# Patient Record
Sex: Female | Born: 1970 | Hispanic: Yes | Marital: Single | Smoking: Never smoker
Health system: Southern US, Community
[De-identification: ages and names within clinical notes are randomized; demographics above are authoritative.]

## PROBLEM LIST (undated history)

## (undated) DIAGNOSIS — I10 Essential (primary) hypertension: Secondary | ICD-10-CM

## (undated) DIAGNOSIS — K754 Autoimmune hepatitis: Secondary | ICD-10-CM

## (undated) HISTORY — DX: Essential (primary) hypertension: I10

## (undated) HISTORY — PX: TUBAL LIGATION: SHX77

## (undated) HISTORY — DX: Autoimmune hepatitis: K75.4

---

## 2004-08-29 ENCOUNTER — Ambulatory Visit: Payer: Self-pay

## 2008-05-11 ENCOUNTER — Ambulatory Visit: Payer: Self-pay

## 2008-05-26 ENCOUNTER — Ambulatory Visit: Payer: Self-pay

## 2008-11-24 ENCOUNTER — Ambulatory Visit: Payer: Self-pay

## 2010-07-17 ENCOUNTER — Ambulatory Visit: Payer: Self-pay

## 2012-12-30 DIAGNOSIS — R87613 High grade squamous intraepithelial lesion on cytologic smear of cervix (HGSIL): Secondary | ICD-10-CM | POA: Insufficient documentation

## 2012-12-30 DIAGNOSIS — R87810 Cervical high risk human papillomavirus (HPV) DNA test positive: Secondary | ICD-10-CM | POA: Insufficient documentation

## 2013-11-14 ENCOUNTER — Emergency Department: Payer: Self-pay | Admitting: Emergency Medicine

## 2013-11-24 ENCOUNTER — Ambulatory Visit: Payer: Self-pay

## 2014-01-05 ENCOUNTER — Ambulatory Visit: Payer: Self-pay

## 2014-01-11 ENCOUNTER — Ambulatory Visit: Payer: Self-pay

## 2014-01-20 ENCOUNTER — Encounter: Payer: Self-pay | Admitting: *Deleted

## 2014-01-31 ENCOUNTER — Encounter: Payer: Self-pay | Admitting: General Surgery

## 2014-01-31 ENCOUNTER — Ambulatory Visit (INDEPENDENT_AMBULATORY_CARE_PROVIDER_SITE_OTHER): Payer: PRIVATE HEALTH INSURANCE | Admitting: General Surgery

## 2014-01-31 VITALS — BP 160/121 | HR 66 | Resp 16 | Ht 62.0 in | Wt 160.0 lb

## 2014-01-31 DIAGNOSIS — N6002 Solitary cyst of left breast: Secondary | ICD-10-CM

## 2014-01-31 DIAGNOSIS — N6001 Solitary cyst of right breast: Secondary | ICD-10-CM

## 2014-01-31 NOTE — Patient Instructions (Addendum)
Return in 3 months for follow up bilateral breast exam and ultrasound.
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
## 2014-03-01 ENCOUNTER — Encounter: Payer: Self-pay | Admitting: General Surgery

## 2014-05-25 ENCOUNTER — Ambulatory Visit: Payer: PRIVATE HEALTH INSURANCE

## 2014-05-25 ENCOUNTER — Encounter: Payer: Self-pay | Admitting: General Surgery

## 2014-05-25 ENCOUNTER — Ambulatory Visit (INDEPENDENT_AMBULATORY_CARE_PROVIDER_SITE_OTHER): Payer: PRIVATE HEALTH INSURANCE | Admitting: General Surgery

## 2014-05-25 VITALS — BP 158/96 | HR 80 | Resp 14 | Ht 62.0 in | Wt 164.0 lb

## 2014-05-25 DIAGNOSIS — N6002 Solitary cyst of left breast: Secondary | ICD-10-CM

## 2014-05-25 DIAGNOSIS — N6001 Solitary cyst of right breast: Secondary | ICD-10-CM
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
## 2014-05-25 NOTE — Patient Instructions (Signed)
Patient to return in 3 months with bilateral diagnostic mammogram.Continue self breast exams. Call office for any new breast issues or concerns.

## 2014-07-26 ENCOUNTER — Encounter: Payer: Self-pay | Admitting: *Deleted

## 2014-08-03 ENCOUNTER — Encounter: Payer: Self-pay | Admitting: General Surgery

## 2014-08-03 ENCOUNTER — Ambulatory Visit (INDEPENDENT_AMBULATORY_CARE_PROVIDER_SITE_OTHER): Payer: PRIVATE HEALTH INSURANCE | Admitting: General Surgery

## 2014-08-03 VITALS — BP 122/78 | HR 76 | Resp 12 | Ht 62.0 in | Wt 166.0 lb

## 2014-08-03 DIAGNOSIS — N6002 Solitary cyst of left breast: Secondary | ICD-10-CM

## 2014-08-03 DIAGNOSIS — N6001 Solitary cyst of right breast: Secondary | ICD-10-CM | POA: Diagnosis not present

## 2014-08-03 NOTE — Progress Notes (Signed)
Patient ID: Kara Alexander, female   DOB: [birth year], 44 y.o.   MRN: [id number]  Chief Complaint  Patient presents with  . Follow-up    mammogram    HPI Kara Alexander is a 44 y.o. female who presents for a breast evaluation. She has her mammogram scheduled for 08/04/14. Patient does perform regular self breast checks and gets regular mammograms done.  She has cysts in breast and was to have had her mammogram before this visit but pt got confused with appointments  HPI  Past Medical History  Diagnosis Date  . Hypertension     Past Surgical History  Procedure Laterality Date  . Tubal ligation      History reviewed. No pertinent family history.  Social History History  Substance Use Topics  . Smoking status: Never Smoker   . Smokeless tobacco: Never Used  . Alcohol Use: No    No Known Allergies  No current outpatient prescriptions on file.   No current facility-administered medications for this visit.    Review of Systems Review of Systems  Constitutional: Negative.   Respiratory: Negative.   Cardiovascular: Negative.     Blood pressure 122/78, pulse 76, resp. rate 12, height 5\' 2"  (1.575 m), weight 166 lb (75.297 kg), last menstrual period 06/15/2014.  Physical Exam Physical Exam  Constitutional: She is oriented to person, place, and time. She appears well-developed and well-nourished.  Eyes: Conjunctivae are normal. No scleral icterus.  Neck: Neck supple.  Pulmonary/Chest: Right breast exhibits mass. Right breast exhibits no inverted nipple, no nipple discharge, no skin change and no tenderness. Left breast exhibits no inverted nipple, no mass, no nipple discharge, no skin change and no tenderness.  1 cm mass right breast unchanged from her visit in October 2015.   Lymphadenopathy:    She has no cervical adenopathy.    She has no axillary adenopathy.  Neurological: She is alert and oriented to person, place, and time.  Skin: Skin is warm and  dry.    Data Reviewed   Assessment    Exam is unchanged from her last visit. If her  mammogram is stable tomorrow she may return to her PCP for her regular yearly breast exam and mammogram.      Plan     Present for interview exam and discussion , Jacqui  Patient is scheduled to have her mammogram 08/04/14 ,   PCP:  Charlotte Crumbubio, Jessica   Xavian Hardcastle G 08/03/2014, 6:35 PM

## 2014-08-03 NOTE — Patient Instructions (Signed)
Advised on need for routine yearly mammogram and exam and her own monthly self exam.

## 2014-08-04 ENCOUNTER — Ambulatory Visit
Admit: 2014-08-04 | Disposition: A | Discharge status: Home / Self Care | Payer: Self-pay | Attending: Urgent Care | Admitting: Urgent Care

## 2014-09-30 ENCOUNTER — Encounter: Payer: Self-pay | Admitting: *Deleted

## 2015-01-04 ENCOUNTER — Other Ambulatory Visit: Payer: Self-pay | Admitting: Oncology

## 2015-01-04 ENCOUNTER — Ambulatory Visit
Admission: RE | Admit: 2015-01-04 | Discharge: 2015-01-04 | Disposition: A | Discharge status: Home / Self Care | Payer: Self-pay | Source: Ambulatory Visit | Attending: Oncology | Admitting: Oncology

## 2015-01-04 ENCOUNTER — Ambulatory Visit: Payer: Self-pay | Attending: Oncology | Admitting: *Deleted

## 2015-01-04 ENCOUNTER — Encounter: Payer: Self-pay | Admitting: *Deleted

## 2015-01-04 VITALS — BP 145/99 | HR 66 | Temp 95.6°F | Ht 61.0 in | Wt 160.6 lb

## 2015-01-04 DIAGNOSIS — Z0000 Encounter for general adult medical examination without abnormal findings: Secondary | ICD-10-CM

## 2015-01-04 DIAGNOSIS — N63 Unspecified lump in unspecified breast: Secondary | ICD-10-CM

## 2015-01-04 NOTE — Patient Instructions (Signed)
Gave patient hand-out, Women Staying Healthy, Active and Well from [facility], with education on breast health, pap smears, heart and colon health. 

## 2015-01-04 NOTE — Progress Notes (Signed)
Subjective:     Patient ID: Kara Alexander, female   DOB: 26-Jan-1971, 44 y.o.   MRN: [id number]  HPI   Review of Systems     Objective:   Physical Exam  Pulmonary/Chest: Right breast exhibits no inverted nipple, no mass, no nipple discharge, no skin change and no tenderness. Left breast exhibits no inverted nipple, no mass, no nipple discharge, no skin change and no tenderness. Breasts are symmetrical.  Abdominal: There is no splenomegaly or hepatomegaly.  Patient states she has "abdominal pain".  Currently under the care of her pcp for this problem.  Genitourinary: No labial fusion. There is no rash, tenderness, lesion or injury on the right labia. There is no rash, tenderness, lesion or injury on the left labia. Cervix exhibits friability. Right adnexum displays no mass, no tenderness and no fullness. Left adnexum displays no tenderness and no fullness. No erythema or tenderness in the vagina. No foreign body around the vagina. No signs of injury around the vagina. No vaginal discharge found.    Friable cervix.  Bled easily on exam.       Assessment:     44 year old Hispanic female returns to [facility] for annual screening.  Last mammo and ultrasound was a birads 3 for a left breast mass.  Also followed by Dr. Evette Cristal.  Stable mass per his last note in April 2016.  There is no palpable mass on exam today.  Taught self breast awareness.  Specimen collected for pap smear.  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  Patient has been screened for eligibility.  She does not have any insurance, Medicare or Medicaid.  She also meets financial eligibility.  Hand-out given on the Affordable Care Act. Blood pressure elevated at 145/99    .  She is to take her meds as soon as possible and recheck her blood pressure at [facility] or [facility], and if remains higher than 140/90 she is to follow-up with her primary care provider.       Plan:     Will get bilateral diagnostic mammogram and  ultrasound.  Specimen sent to the lab.  Will follow up per protocol.

## 2015-01-06 LAB — PAP LB AND HPV HIGH-RISK
HPV, HIGH-RISK: NEGATIVE
PAP SMEAR COMMENT: 0

## 2015-01-16 ENCOUNTER — Encounter: Payer: Self-pay | Admitting: *Deleted

## 2015-01-16 NOTE — Progress Notes (Signed)
Letter mailed to inform patient of her normal mammogram and pap smear.  Next mammo due in one year, and next pap due in 3 years per protocol.  [facility] to [place].

## 2015-06-08 ENCOUNTER — Encounter: Payer: Self-pay | Admitting: General Surgery

## 2015-06-08 ENCOUNTER — Ambulatory Visit (INDEPENDENT_AMBULATORY_CARE_PROVIDER_SITE_OTHER): Payer: PRIVATE HEALTH INSURANCE | Admitting: General Surgery

## 2015-06-08 VITALS — BP 170/96 | HR 72 | Resp 12 | Ht 60.0 in | Wt 164.0 lb

## 2015-06-08 DIAGNOSIS — N6002 Solitary cyst of left breast: Secondary | ICD-10-CM

## 2015-06-08 NOTE — Patient Instructions (Addendum)
Patient aware to call back with any changes or concerns. Use zinc oxide or calamine lotion over the area.
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
## 2016-06-05 ENCOUNTER — Ambulatory Visit
Admission: RE | Admit: 2016-06-05 | Discharge: 2016-06-05 | Disposition: A | Discharge status: Home / Self Care | Payer: Self-pay | Source: Ambulatory Visit | Attending: Oncology | Admitting: Oncology

## 2016-06-05 ENCOUNTER — Encounter (INDEPENDENT_AMBULATORY_CARE_PROVIDER_SITE_OTHER): Payer: Self-pay

## 2016-06-05 ENCOUNTER — Ambulatory Visit: Payer: Self-pay | Attending: Oncology

## 2016-06-05 VITALS — BP 149/108 | HR 69 | Temp 97.9°F | Resp 18 | Ht 61.42 in | Wt 162.3 lb

## 2016-06-05 DIAGNOSIS — Z0000 Encounter for general adult medical examination without abnormal findings: Secondary | ICD-10-CM

## 2016-06-05 NOTE — Progress Notes (Signed)
This Clinical research associatewriter educated pt re bp today was 149/108 . Stated she took anti hypertensive meds this am. Educated re salt intake/ exercise and following [facility]. Pt in NAD this pm. Kara Alexander/C Tunya Held RN

## 2016-06-05 NOTE — Progress Notes (Signed)
Subjective:     Patient ID: Kara SavageMargarita Ramirez Alexander, female   DOB: 09-Mar-1971, 46 y.o.   MRN: [id number]  HPI   Review of Systems     Objective:   Physical Exam  Pulmonary/Chest: Right breast exhibits no inverted nipple, no mass, no nipple discharge, no skin change and no tenderness. Left breast exhibits no inverted nipple, no mass, no nipple discharge, no skin change and no tenderness. Breasts are symmetrical.       Assessment:     46 year old hispanic patient presents for [facility] clinic visit. Patient screened, and meets [facility] eligibility.  Patient does not have insurance, Medicare or Medicaid.  Handout given on Affordable Care Act.  Instructed patient on breast self-exam using teach back method.  CBE unremarkable.  No mass or lump palpated. Jaqui Laukaitis interpreted exam.  Patient had a negative /negative pap in September 2016.    Plan:     Sent for bilateral screening mammogram.

## 2016-08-22 NOTE — Progress Notes (Signed)
Letter mailed from [facility] to notify of normal mammogram results.  Patient to return in one year for annual screening.  Copy to [facility]. 

## 2017-02-26 ENCOUNTER — Ambulatory Visit: Payer: Self-pay | Attending: Oncology

## 2017-06-18 ENCOUNTER — Ambulatory Visit
Admission: RE | Admit: 2017-06-18 | Discharge: 2017-06-18 | Disposition: A | Discharge status: Home / Self Care | Payer: Self-pay | Source: Ambulatory Visit | Attending: Oncology | Admitting: Oncology

## 2017-06-18 ENCOUNTER — Ambulatory Visit: Payer: Self-pay | Attending: Oncology

## 2017-06-18 VITALS — BP 167/107 | HR 64 | Temp 98.2°F | Ht 62.0 in | Wt 160.0 lb

## 2017-06-18 DIAGNOSIS — Z0000 Encounter for general adult medical examination without abnormal findings: Secondary | ICD-10-CM

## 2017-06-18 NOTE — Progress Notes (Signed)
Subjective:     Patient ID: Kara SavageMargarita Sosa Alexander, female   DOB: [birth year], 47 y.o.   MRN: [id number]  HPI   Review of Systems     Objective:   Physical Exam  Pulmonary/Chest: Right breast exhibits no inverted nipple, no mass, no nipple discharge, no skin change and no tenderness. Left breast exhibits skin change. Left breast exhibits no inverted nipple, no mass, no nipple discharge and no tenderness. Breasts are symmetrical.    Flat light pink dry  Itchy patches left breast.  Genitourinary: No labial fusion. There is no rash, tenderness, lesion or injury on the right labia. There is no rash, tenderness, lesion or injury on the left labia. Uterus is not deviated, not enlarged, not fixed and not tender. Cervix exhibits no motion tenderness, no discharge and no friability. Right adnexum displays no mass, no tenderness and no fullness. Left adnexum displays no mass, no tenderness and no fullness. No erythema, tenderness or bleeding in the vagina. No foreign body in the vagina. No signs of injury around the vagina. No vaginal discharge found.       Assessment:     47 [facility]  Delos HaringLoyda Murr interpreted exam. Patient screened, and meets [facility] eligibility.  Patient does not have insurance, Medicare or Medicaid.  Handout given on Affordable Care Act.  Instructed patient on breast self awareness using teach back method.  Patient followed in past for mammogram showing oil cysts. No breast mass or lump palpated.  Dr. Evette CristalSankar previously noted skin changes when following patient, and recommended Zinc oxide.  Today noted light pink flat areas of dry skin.    [facility] follow skin findings.  Recommended she try Lotrimin ointment.  Pelvic exam normal.    Plan:     Sent for bilateral screening mammogram.  Specimen collected for pap.

## 2017-06-20 ENCOUNTER — Other Ambulatory Visit: Payer: Self-pay | Admitting: *Deleted

## 2017-06-20 DIAGNOSIS — N63 Unspecified lump in unspecified breast: Secondary | ICD-10-CM

## 2017-06-23 LAB — PAP LB AND HPV HIGH-RISK
HPV, high-risk: POSITIVE — AB
PAP SMEAR COMMENT: 0

## 2017-07-01 ENCOUNTER — Ambulatory Visit
Admission: RE | Admit: 2017-07-01 | Discharge: 2017-07-01 | Disposition: A | Discharge status: Home / Self Care | Payer: Self-pay | Source: Ambulatory Visit | Attending: Oncology | Admitting: Oncology

## 2017-07-01 DIAGNOSIS — N63 Unspecified lump in unspecified breast: Secondary | ICD-10-CM

## 2017-07-02 NOTE — Progress Notes (Signed)
Mailed letter to notify of Birads 2 mammogram results, and negative/positive pap results.  Due to positive HPV, patient will require a repeat pap in one year.  She is to call in February 2020 to schedule her annual [facility] appointment.  Copy to [facility].

## 2019-02-06 ENCOUNTER — Encounter: Payer: Self-pay | Admitting: Emergency Medicine

## 2019-02-06 ENCOUNTER — Other Ambulatory Visit: Payer: Self-pay

## 2019-02-06 ENCOUNTER — Emergency Department
Admission: EM | Admit: 2019-02-06 | Discharge: 2019-02-07 | Disposition: A | Discharge status: Other Acute Inpatient Hospital | Payer: Self-pay | Attending: Emergency Medicine | Admitting: Emergency Medicine

## 2019-02-06 ENCOUNTER — Emergency Department: Payer: Self-pay

## 2019-02-06 DIAGNOSIS — U071 COVID-19: Secondary | ICD-10-CM | POA: Insufficient documentation

## 2019-02-06 DIAGNOSIS — I10 Essential (primary) hypertension: Secondary | ICD-10-CM | POA: Insufficient documentation

## 2019-02-06 DIAGNOSIS — R945 Abnormal results of liver function studies: Secondary | ICD-10-CM | POA: Insufficient documentation

## 2019-02-06 DIAGNOSIS — B179 Acute viral hepatitis, unspecified: Secondary | ICD-10-CM | POA: Insufficient documentation

## 2019-02-06 DIAGNOSIS — R7989 Other specified abnormal findings of blood chemistry: Secondary | ICD-10-CM

## 2019-02-06 LAB — AMMONIA: Ammonia: 27 umol/L (ref 9–35)

## 2019-02-06 LAB — COMPREHENSIVE METABOLIC PANEL
ALT: 1729 U/L — ABNORMAL HIGH (ref 0–44)
AST: 1452 U/L — ABNORMAL HIGH (ref 15–41)
Albumin: 3.3 g/dL — ABNORMAL LOW (ref 3.5–5.0)
Alkaline Phosphatase: 204 U/L — ABNORMAL HIGH (ref 38–126)
Anion gap: 7 (ref 5–15)
BUN: 10 mg/dL (ref 6–20)
CO2: 25 mmol/L (ref 22–32)
Calcium: 9 mg/dL (ref 8.9–10.3)
Chloride: 105 mmol/L (ref 98–111)
Creatinine, Ser: 0.78 mg/dL (ref 0.44–1.00)
GFR calc Af Amer: >60 mL/min (ref >60)
GFR calc non Af Amer: >60 mL/min (ref >60)
Glucose, Bld: 120 mg/dL — ABNORMAL HIGH (ref 70–99)
Potassium: 3.7 mmol/L (ref 3.5–5.1)
Sodium: 137 mmol/L (ref 135–145)
Total Bilirubin: 8.9 mg/dL — ABNORMAL HIGH (ref 0.3–1.2)
Total Protein: 9.3 g/dL — ABNORMAL HIGH (ref 6.5–8.1)

## 2019-02-06 LAB — CBC WITH DIFFERENTIAL/PLATELET
Abs Immature Granulocytes: 0.02 10*3/uL (ref 0.00–0.07)
Basophils Absolute: 0 10*3/uL (ref 0.0–0.1)
Basophils Relative: 0 %
Eosinophils Absolute: 0.2 10*3/uL (ref 0.0–0.5)
Eosinophils Relative: 4 %
HCT: 40.9 % (ref 36.0–46.0)
Hemoglobin: 13.9 g/dL (ref 12.0–15.0)
Immature Granulocytes: 0 %
Lymphocytes Relative: 40 %
Lymphs Abs: 1.8 10*3/uL (ref 0.7–4.0)
MCH: 31.3 pg (ref 26.0–34.0)
MCHC: 34 g/dL (ref 30.0–36.0)
MCV: 92.1 fL (ref 80.0–100.0)
Monocytes Absolute: 0.4 10*3/uL (ref 0.1–1.0)
Monocytes Relative: 8 %
Neutro Abs: 2.2 10*3/uL (ref 1.7–7.7)
Neutrophils Relative %: 48 %
Platelets: 200 10*3/uL (ref 150–400)
RBC: 4.44 MIL/uL (ref 3.87–5.11)
RDW: 13.2 % (ref 11.5–15.5)
WBC: 4.6 10*3/uL (ref 4.0–10.5)
nRBC: 0 % (ref 0.0–0.2)

## 2019-02-06 LAB — URINALYSIS, COMPLETE (UACMP) WITH MICROSCOPIC
Bacteria, UA: NONE SEEN
Bilirubin Urine: NEGATIVE
Glucose, UA: NEGATIVE mg/dL
Ketones, ur: NEGATIVE mg/dL
Leukocytes,Ua: NEGATIVE
Nitrite: NEGATIVE
Protein, ur: NEGATIVE mg/dL
Specific Gravity, Urine: 1.009 (ref 1.005–1.030)
pH: 6 (ref 5.0–8.0)

## 2019-02-06 LAB — PROTIME-INR
INR: 1.1 (ref 0.8–1.2)
Prothrombin Time: 14 seconds (ref 11.4–15.2)

## 2019-02-06 LAB — ACETAMINOPHEN LEVEL: Acetaminophen (Tylenol), Serum: <10 ug/mL — ABNORMAL LOW (ref 10–30)

## 2019-02-06 LAB — SARS CORONAVIRUS 2 BY RT PCR (HOSPITAL ORDER, PERFORMED IN ~~LOC~~ HOSPITAL LAB): SARS Coronavirus 2: POSITIVE — AB

## 2019-02-06 NOTE — ED Triage Notes (Signed)
Patient had positive COVID test on 9/28. States had chills and fever for 3 days prior. Concerned due to amount of time passing and still having fever and nausea after eating. Poor appetite.

## 2019-02-06 NOTE — ED Notes (Signed)
First Nurse Note: Pt tested positive for COVID on 01/25/19. Pt still not feeling well.

## 2019-02-06 NOTE — ED Notes (Signed)
Ultrasound complete. Pt given water, had requested to facilitate getting a urine sample

## 2019-02-06 NOTE — ED Notes (Signed)
Spoke with lab - the covid is positive. Dr made aware

## 2019-02-06 NOTE — ED Provider Notes (Signed)
[facility] Emergency Department Provider Note ____________________________________________   First MD Initiated Contact with Patient 02/06/19 1531     (approximate)  I have reviewed the triage vital signs and the nursing notes.   HISTORY  Chief Complaint COVID  HPI and ROS obtained via video interpreter  HPI Kara Alexander is a 48 y.o. female with a history of hypertension but no other significant PMH who presents with nausea, upset stomach, and dark urine over the last several weeks.  The patient has also had subjective fevers.  She reports decreased appetite.  She states that she does not have significant abdominal pain in between eating.  She denies shortness of breath.  She initially became symptomatic with fever on 9/25 and tested positive for COVID-19 on 9/28.  She has no known history of liver problems.   Past Medical History:  Diagnosis Date  . Hypertension     There are no active problems to display for this patient.   Past Surgical History:  Procedure Laterality Date  . TUBAL LIGATION      Prior to Admission medications   Medication Sig Start Date End Date Taking? Authorizing Provider  lisinopril-hydrochlorothiazide (PRINZIDE,ZESTORETIC) 20-25 MG tablet Take 1 tablet by mouth daily.    [provider]    Allergies Patient has no known allergies.  Family History  Problem Relation Age of Onset  . Breast cancer Neg Hx     Social History Social History   Tobacco Use  . Smoking status: Never Smoker  . Smokeless tobacco: Never Used  Substance Use Topics  . Alcohol use: No    Alcohol/week: 0.0 standard drinks  . Drug use: No    Review of Systems  Constitutional: Positive for fever. Eyes: No redness. ENT: No sore throat. Cardiovascular: Denies chest pain. Respiratory: Denies shortness of breath. Gastrointestinal: Positive for nausea. Genitourinary: Positive for dark urine. Musculoskeletal: Negative for  back pain. Skin: Negative for rash. Neurological: Negative for headache.   ____________________________________________   PHYSICAL EXAM:  VITAL SIGNS: ED Triage Vitals  Enc Vitals Group     BP 02/06/19 1501 (!) 145/89     Pulse Rate 02/06/19 1501 79     Resp 02/06/19 1501 20     Temp 02/06/19 1501 99.3 F (37.4 C)     Temp Source 02/06/19 1501 Oral     SpO2 02/06/19 1501 95 %     Weight 02/06/19 1502 161 lb (73 kg)     Height --      Head Circumference --      Peak Flow --      Pain Score 02/06/19 1502 0     Pain Loc --      Pain Edu? --      Excl. in GC? --     Constitutional: Alert and oriented.  Relatively well appearing and in no acute distress. Eyes: Conjunctivae are normal.  No significant scleral icterus. Head: Atraumatic. Nose: No congestion/rhinnorhea. Mouth/Throat: Mucous membranes are moist.   Neck: Normal range of motion.  Cardiovascular:  Good peripheral circulation. Respiratory: Normal respiratory effort.  No retractions. Gastrointestinal: Soft with mild epigastric discomfort but no focal tenderness.  No distention.  Genitourinary: No flank tenderness. Musculoskeletal: No lower extremity edema.  Extremities warm and well perfused.  Neurologic:  Normal speech and language. No gross focal neurologic deficits are appreciated.  Skin:  Skin is warm and dry. No rash noted. Psychiatric: Mood and affect are normal. Speech and behavior are normal.  ____________________________________________   LABS (all labs ordered are listed, but only abnormal results are displayed)  Labs Reviewed  SARS CORONAVIRUS 2 BY RT PCR ([facility]) - Abnormal; Notable for the following components:      Result Value   SARS Coronavirus 2 POSITIVE (*)    All other components within normal limits  COMPREHENSIVE METABOLIC PANEL - Abnormal; Notable for the following components:   Glucose, Bld 120 (*)    Total Protein 9.3 (*)    Albumin 3.3  (*)    AST 1,452 (*)    ALT 1,729 (*)    Alkaline Phosphatase 204 (*)    Total Bilirubin 8.9 (*)    All other components within normal limits  URINALYSIS, COMPLETE (UACMP) WITH MICROSCOPIC - Abnormal; Notable for the following components:   Color, Urine AMBER (*)    APPearance CLEAR (*)    Hgb urine dipstick SMALL (*)    All other components within normal limits  CBC WITH DIFFERENTIAL/PLATELET  AMMONIA  HEPATITIS PANEL, ACUTE  PROTIME-INR  ACETAMINOPHEN LEVEL   ____________________________________________  EKG   ____________________________________________  RADIOLOGY  CXR: No focal infiltrate or other acute abnormality US abdomen RUQ: No biliary duct dilatation  ____________________________________________   PROCEDURES  Procedure(s) performed: No  Procedures  Critical Care performed: No ____________________________________________   INITIAL IMPRESSION / ASSESSMENT AND PLAN / ED COURSE  Pertinent labs & imaging results that were available during my care of the patient were reviewed by me and considered in my medical decision making (see chart for details).  48 year old female with a history of hypertension presents with persistent fevers, nausea, decreased appetite, and upset stomach after eating over the last several weeks after being diagnosed with COVID-19 on 9/28.  The patient denies any significant respiratory symptoms at this time.  On exam she is overall well-appearing.  She has a borderline elevated temperature with otherwise normal vital signs.  She has no respiratory distress or increased work of breathing.  She has mild epigastric discomfort to palpation.  Initial lab work-up reveals significantly elevated LFTs and bilirubin.  Differential would include hepatitis related to COVID-19, versus other possible causes.  The patient likely will need admission for further work-up.  I will obtain a right upper quadrant ultrasound to further evaluate as well as a  repeat COVID test.  ----------------------------------------- 10:06 PM on 02/06/2019 -----------------------------------------  The patient is still positive for COVID-19.  Ultrasound shows no evidence of bile duct obstruction.  I initially plan to admit the patient to the [facility] as per [employer].  I discussed the case with the hospitalist there who suggested that we speak to the gastroenterologist on-call, as there are certain test such as a HIDA scan which will cannot be performed at the [facility], and to determine if the patient would require any of the services that would not be available there.  I then discussed the case with Dr. Lyndel Safe from gastroenterology at [facility].  He advised that he was concerned that the patient was having COVID-19 hepatitis which can result in liver failure.  He advised that although the patient is stable and currently will mainly need monitoring, there is a significant chance that she could decompensate or develop liver failure.  Therefore he would recommend that we transfer her to a tertiary center that can handle advanced liver disease.  I then contacted the transfer center at [facility].  I discussed the case with Dr. Manuella Ghazi from  hepatology as well as Dr. Mordecai Rasmussen of from the hospitalist service.  They agreed with the transfer and Dr. Mordecai Rasmussen accepted the patient.  At this time, the patient continues to be clinically stable with no change in her vital signs.  She has no respiratory symptoms at this time.  She is stable for transfer.  I discussed the transfer plan with the patient extensively via the video interpreter.  _________________________________  Kara Savage was evaluated in Emergency Department on 02/06/2019 for the symptoms described in the history of present illness. She was evaluated in the context of the global COVID-19 pandemic, which necessitated consideration that the patient might be at risk for infection with the  SARS-CoV-2 virus that causes COVID-19. Institutional protocols and algorithms that pertain to the evaluation of patients at risk for COVID-19 are in a state of rapid change based on information released by regulatory bodies including the CDC and federal and state organizations. These policies and algorithms were followed during the patient's care in the ED.  ____________________________________________   FINAL CLINICAL IMPRESSION(S) / ED DIAGNOSES  Final diagnoses:  COVID-19  Elevated LFTs  Acute hepatitis      NEW MEDICATIONS STARTED DURING THIS VISIT:  New Prescriptions   No medications on file     Note:  This document was prepared using Dragon voice recognition software and may include unintentional dictation errors.    Dionne Bucy, MD 02/06/19 2208

## 2019-02-07 ENCOUNTER — Other Ambulatory Visit: Payer: Self-pay

## 2019-02-07 DIAGNOSIS — K759 Inflammatory liver disease, unspecified: Secondary | ICD-10-CM | POA: Insufficient documentation

## 2019-02-07 DIAGNOSIS — U071 COVID-19: Secondary | ICD-10-CM | POA: Insufficient documentation

## 2019-02-07 DIAGNOSIS — S36119A Unspecified injury of liver, initial encounter: Secondary | ICD-10-CM | POA: Insufficient documentation

## 2019-02-07 LAB — HEPATITIS PANEL, ACUTE
HCV Ab: NONREACTIVE
Hep A IgM: NONREACTIVE
Hep B C IgM: NONREACTIVE
Hepatitis B Surface Ag: NONREACTIVE

## 2019-02-07 NOTE — ED Notes (Signed)
Pt resting quietly; no complaints or requests;

## 2019-02-07 NOTE — ED Notes (Signed)
Per ED Secretary Edd Arbour, [facility] is enroute and will be arriving in about 45 minutes.

## 2019-02-07 NOTE — ED Notes (Signed)
Previous comment was in error

## 2019-02-07 NOTE — ED Notes (Signed)
EMTALA reviewed by this RN.  

## 2019-02-07 NOTE — ED Notes (Signed)
Sarah from [facility] called for update. Informed her that the patient currently is awaiting transportation to [facility].

## 2019-02-07 NOTE — ED Notes (Signed)
Pt understands no transport until after 7am; given cup of water and full water pitcher; warm blanket; HOB lowered and lights dimmed; denies pain; call bell in reach and encouraged to call for any needs;

## 2019-02-07 NOTE — ED Provider Notes (Signed)
-----------------------------------------   6:50 AM on 02/07/2019 -----------------------------------------   Blood pressure (!) 135/91, pulse 84, temperature 99.1 F (37.3 C), temperature source Oral, resp. rate 18, weight 73 kg, last menstrual period 11/11/2014, SpO2 94 %.  The patient is calm and cooperative at this time.  There have been no acute events since the last update.  Awaiting bed assignment at and transfer to [facility] for COVID-19-associated hepatitis.  Hemodynamically stable.   Hinda Kehr, MD 02/07/19 [phone]

## 2019-02-07 NOTE — ED Notes (Signed)
Patient was not assigned to [facility]

## 2019-02-07 NOTE — ED Notes (Signed)
Ruby called  From [facility] with bed assignment  [id number]  [phone]

## 2019-02-07 NOTE — ED Notes (Signed)
Assessment/Update via video interpreter [place].  Pt reports she has been able to get some rest overnight, states she woke up around 4am with some lower abdominal pain, but it went away. Pt denies any pain at this time. Pt informed that we are awaiting transportation to [facility] at this time.  Pt states she did not feel like she had any fevers overnight like she had been.  No distress noted at this time. Pt denies any further needs. Pt has water pitcher at bedside.   IV flushed at this time.

## 2019-02-07 NOTE — ED Notes (Signed)
Updated Sarah RN at [facility] on patient's status, informed her that the patient would be arriving after 10am.

## 2019-02-07 NOTE — ED Notes (Signed)
Adam with [facility] reports no trucks tonight; they will try to find another agency to transport but pt could be here until after 7am shift change;

## 2019-03-22 ENCOUNTER — Ambulatory Visit: Payer: Self-pay | Admitting: Gastroenterology

## 2020-07-25 ENCOUNTER — Encounter: Payer: Self-pay | Admitting: *Deleted

## 2020-07-25 ENCOUNTER — Ambulatory Visit: Payer: Self-pay | Attending: Oncology | Admitting: *Deleted

## 2020-07-25 ENCOUNTER — Other Ambulatory Visit: Payer: Self-pay

## 2020-07-25 ENCOUNTER — Encounter (INDEPENDENT_AMBULATORY_CARE_PROVIDER_SITE_OTHER): Payer: Self-pay

## 2020-07-25 ENCOUNTER — Ambulatory Visit
Admission: RE | Admit: 2020-07-25 | Discharge: 2020-07-25 | Disposition: A | Discharge status: Home / Self Care | Payer: Self-pay | Source: Ambulatory Visit | Attending: Oncology | Admitting: Oncology

## 2020-07-25 VITALS — BP 151/98 | HR 73 | Temp 97.9°F | Ht 63.0 in | Wt 172.4 lb

## 2020-07-25 DIAGNOSIS — Z0000 Encounter for general adult medical examination without abnormal findings: Secondary | ICD-10-CM | POA: Insufficient documentation

## 2020-07-25 NOTE — Progress Notes (Signed)
Subjective:     Patient ID: Kara Alexander, female   DOB: 1970/12/13, 50 y.o.   MRN: [id number]  HPI  [facility]CP Medical History Record - 07/25/20 1420      Breast History   Screening cycle Rescreen    CBE Date 06/18/17    Provider (CBE) [facility]    Initial Mammogram 07/25/20    Last Mammogram Annual    Last Mammogram Date 06/18/17    Provider (Mammogram)  Delford Field    Recent Breast Symptoms None      Breast Cancer History   Breast Cancer History No personal or family history      Previous History of Breast Problems   Breast Surgery or Biopsy None    Breast Implants N/A    BSE Done Monthly      Gynecological/Obstetrical History   LMP 07/26/14    Age at menarche 73    Age at menopause 32    PAP smear history Annually    Date of last PAP  06/18/17    Provider (PAP) [facility]    Age at first live birth 42    Breast fed children Yes (type length in comments)   3 years   DES Exposure Unkown    Cervical, Uterine or Ovarian cancer No    Family history of Cervial, Uterine or Ovarian cancer No    Hysterectomy No    Cervix removed No    Ovaries removed No    Laser/Cryosurgery No    Current method of birth control Other (see comments)   tubaligation   Current method of Estrogen/Hormone replacement None    Smoking history None           Review of Systems     Objective:   Physical Exam Chest:       Comments: Patient states the scar like tissue is from a history of puritis Abdominal:     Palpations: There is no hepatomegaly or splenomegaly.    Genitourinary:    Exam position: Lithotomy position.     Labia:        Right: No rash, tenderness, lesion or injury.        Left: No rash, tenderness, lesion or injury.      Vagina: No signs of injury and foreign body. No vaginal discharge, erythema, tenderness, bleeding, lesions or prolapsed vaginal walls.     Cervix: No cervical motion tenderness or friability.     Uterus: Not deviated and not enlarged.      Adnexa:         Right: No mass.         Left: No mass.       Rectum: No mass.        Assessment:     50 year old Hispanic female returns to [facility] for annual screening.  Kara Alexander, the interpreter present during the interview and exam.  On clinical breast exam the left areola is asymmetrical with a scar like area at 1:00.  Patient states she had an area that was "itching" a couple of years ago.  States she was given a cream by a female doctor in [place], and that it had really helped.  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  Last mammogram in 2019 was a birads 2.  Last pap on 06/18/17 was negative / HPV +.  Patient did not return for repeat annual pap.  Specimen collected for pap smear today.  Patient has been screened for eligibility.  She does not have any  insurance, Medicare or Medicaid.  She also meets financial eligibility.   Risk Assessment    Risk Scores      07/25/2020   Last edited by: Jim Like, RN   5-year risk: 0.5 %   Lifetime risk: 4.4 %            Plan:     Screening mammogram ordered.  Specimen for pap sent to the lab.  Will follow up per BCCCP protocol.

## 2020-07-25 NOTE — Patient Instructions (Signed)
Gave patient hand-out, Women Staying Healthy, Active and Well from [facility], with education on breast health, pap smears, heart and colon health. 

## 2020-07-28 LAB — IGP, APTIMA HPV: HPV Aptima: NEGATIVE

## 2020-08-10 ENCOUNTER — Encounter: Payer: Self-pay | Admitting: *Deleted

## 2020-08-10 NOTE — Progress Notes (Unsigned)
Letter mailed to inform patient of her normal mammogram and pap smear.  She is to follow up in one year for her mammogram and pap in 5 years.

## 2021-06-29 ENCOUNTER — Other Ambulatory Visit: Payer: Self-pay

## 2021-06-29 DIAGNOSIS — Z1231 Encounter for screening mammogram for malignant neoplasm of breast: Secondary | ICD-10-CM

## 2021-07-24 ENCOUNTER — Ambulatory Visit: Payer: Self-pay

## 2021-07-31 ENCOUNTER — Ambulatory Visit: Payer: Self-pay

## 2021-08-01 ENCOUNTER — Other Ambulatory Visit: Payer: Self-pay

## 2021-08-01 DIAGNOSIS — Z1211 Encounter for screening for malignant neoplasm of colon: Secondary | ICD-10-CM

## 2021-08-07 ENCOUNTER — Ambulatory Visit: Payer: Self-pay | Attending: Hematology and Oncology | Admitting: *Deleted

## 2021-08-07 ENCOUNTER — Ambulatory Visit
Admission: RE | Admit: 2021-08-07 | Discharge: 2021-08-07 | Disposition: A | Discharge status: Home / Self Care | Payer: Self-pay | Source: Ambulatory Visit | Attending: Obstetrics and Gynecology | Admitting: Obstetrics and Gynecology

## 2021-08-07 VITALS — BP 108/73 | HR 67 | Resp 18 | Wt 149.0 lb

## 2021-08-07 DIAGNOSIS — Z1231 Encounter for screening mammogram for malignant neoplasm of breast: Secondary | ICD-10-CM | POA: Insufficient documentation

## 2021-08-07 DIAGNOSIS — Z1239 Encounter for other screening for malignant neoplasm of breast: Secondary | ICD-10-CM

## 2021-08-07 NOTE — Patient Instructions (Addendum)
Explained breast self awareness with Madaline Savage. Patient did not need a Pap smear today due to last Pap smear and HPV typing was 07/25/2020. Let patient know that based on her history of an abnormal Pap smear that her next Pap smear will be due in 3 years. Referred patient to the [facility] for a screening mammogram. Appointment scheduled Tuesday, August 07, 2021. Patient aware of appointment and will be there. Let patient know will follow up with her within the next couple weeks with results. Shannan Harper Derrell Lolling verbalized understanding. ? ?Lasandra Batley, Kathaleen Maser, RN ?12:04 PM ? ? ? ? ?

## 2021-08-07 NOTE — Progress Notes (Signed)
[name] is a 51 y.o. female who presents to [facility] today with complaint of weight loss without trying. Patient stated she is being followed by her PCP at [facility] and that her provider is aware.  ?  ?Pap Smear: Pap smear not completed today. Last Pap smear was 07/25/2020 at [facility] clinic and was normal with negative HPV. 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 Patient stated that all Pap smears have been normal since colposcopy and that she has had at least three normal Pap smears. Last Pap smear result is available in Epic. ?  ?Physical exam: ?Breasts ?Breasts symmetrical. No skin abnormalities bilateral breasts. No nipple retraction bilateral breasts. No nipple discharge bilateral breasts. No lymphadenopathy. No lumps palpated bilateral breasts. No complaints of pain or tenderness on exam.     ? ?MS DIGITAL SCREENING TOMO BILATERAL ? ?Result Date: 07/27/2020 ?CLINICAL DATA:  Screening. EXAM: DIGITAL SCREENING BILATERAL MAMMOGRAM WITH TOMOSYNTHESIS AND CAD TECHNIQUE: Bilateral screening digital craniocaudal and mediolateral oblique mammograms were obtained. Bilateral screening digital breast tomosynthesis was performed. The images were evaluated with computer-aided detection. COMPARISON:  Previous exam(s). ACR Breast Density Category b: There are scattered areas of fibroglandular density. FINDINGS: There are no findings suspicious for malignancy. The images were evaluated with computer-aided detection. IMPRESSION: No mammographic evidence of malignancy. A result letter of this screening mammogram will be mailed directly to the patient. RECOMMENDATION: Screening mammogram in one year. (Code:SM-B-01Y) BI-RADS CATEGORY  1: Negative. Electronically Signed   By: Baird Lyons M.D.   On: 07/27/2020 08:38  ? ?MS DIGITAL SCREENING TOMO BILATERAL ? ?Result Date: 06/19/2017 ?CLINICAL DATA:   Screening. EXAM: DIGITAL SCREENING BILATERAL MAMMOGRAM WITH TOMO AND CAD COMPARISON:  Previous exam(s). ACR Breast Density Category b: There are scattered areas of fibroglandular density. FINDINGS: In the left breast, a possible mass warrants further evaluation. In the right breast, no findings suspicious for malignancy. Images were processed with CAD. IMPRESSION: Further evaluation is suggested for possible mass in the left breast. RECOMMENDATION: Diagnostic mammogram and possibly ultrasound of the left breast. (Code:FI-L-27M) The patient will be contacted regarding the findings, and additional imaging will be scheduled. BI-RADS CATEGORY  0: Incomplete. Need additional imaging evaluation and/or prior mammograms for comparison. Electronically Signed   By: Frederico Hamman M.D.   On: 06/19/2017 10:20  ? ?MS DIGITAL DIAG TOMO UNI LEFT ? ?Result Date: 07/01/2017 ?CLINICAL DATA:  Screening recall for possible left breast mass. EXAM: DIGITAL DIAGNOSTIC UNILATERAL LEFT MAMMOGRAM WITH CAD AND TOMO LEFT BREAST ULTRASOUND COMPARISON:  Previous exam(s). ACR Breast Density Category b: There are scattered areas of fibroglandular density. FINDINGS: Spot compression CC and MLO tomograms were performed of the left breast. There is an oval circumscribed mass in the central to slightly upper left breast measuring approximately 1.4 cm. Mammographic images were processed with CAD. Targeted ultrasound of the left breast was performed. There is a cyst at 12 o'clock 2 cm from nipple measuring 1 x 0.6 x 1.5 cm. This corresponds well with the mass seen in the left breast at mammography. IMPRESSION: Left breast cyst. There are no findings of malignancy in the left breast. RECOMMENDATION: Recommend annual routine screening mammography. I have discussed the findings and recommendations with the patient. Results were also provided in writing at the conclusion of the visit. If applicable, a reminder letter will be sent to the patient regarding  the next appointment. BI-RADS CATEGORY  2: Benign. Electronically Signed   By: Edwin Cap M.D.   On: 07/01/2017 15:45   ?  ?Pelvic/Bimanual ?Pap is not indicated today per BCCCP guidelines. ?  ?Smoking History: ?Patient has never smoked. ?  ?Patient Navigation: ?Patient education provided. Access to services provided for patient through [employer] program. Spanish interpreter Delos Haring from [facility] provided.  ? ?Colorectal Cancer Screening: ?Per patient has never had colonoscopy completed. FIT Test given to patient to complete. No complaints today.  ?  ?Breast and Cervical Cancer Risk Assessment: ?Patient does not have family history of breast cancer, known genetic mutations, or radiation treatment to the chest before age 75. Patient has history of cervical dysplasia. Patient has no history of being immunocompromised or DES exposure in-utero. ? ?Risk Assessment   ? ? Risk Scores   ? ?   08/07/2021 07/25/2020  ? Last edited by: Lesle Chris, RN Jim Like, RN  ? 5-year risk: 0.5 % 0.5 %  ? Lifetime risk: 4.3 % 4.4 %  ? ?  ?  ? ?  ? ? ?A: ?BCCCP exam without pap smear ?No complaints. ? ?P: ?Referred patient to the [facility] for a screening mammogram. Appointment scheduled Tuesday, August 07, 2021. ? ?Priscille Heidelberg, RN ?08/07/2021 12:03 PM   ?

## 2022-06-25 ENCOUNTER — Encounter: Payer: Self-pay | Admitting: Primary Care

## 2022-07-19 ENCOUNTER — Other Ambulatory Visit: Payer: Self-pay

## 2022-07-19 DIAGNOSIS — Z1231 Encounter for screening mammogram for malignant neoplasm of breast: Secondary | ICD-10-CM

## 2022-08-12 ENCOUNTER — Ambulatory Visit
Admission: RE | Admit: 2022-08-12 | Discharge: 2022-08-12 | Disposition: A | Discharge status: Home / Self Care | Payer: Self-pay | Source: Ambulatory Visit | Attending: Obstetrics and Gynecology | Admitting: Obstetrics and Gynecology

## 2022-08-12 ENCOUNTER — Ambulatory Visit: Payer: Self-pay | Attending: Hematology and Oncology | Admitting: Hematology and Oncology

## 2022-08-12 VITALS — BP 128/85 | Wt 156.3 lb

## 2022-08-12 DIAGNOSIS — Z1231 Encounter for screening mammogram for malignant neoplasm of breast: Secondary | ICD-10-CM

## 2022-08-12 NOTE — Progress Notes (Signed)
[name] is a 52 y.o. female who presents to [facility] clinic today with no complaints.    Pap Smear: Pap not smear completed today. Last Pap smear was 2022 at [facility] and was normal. Per patient has no history of an abnormal Pap smear. Last Pap smear result is available in Epic. She was HPV+ and normal in 05/2017. This was repeated in 10/2017 and was normal with negative HPV. She had normal and negative HPV again in 2022. We discussed repeating the pap smear to give three normals in a row. She will return on a free screening day for this.    Physical exam: Breasts Breasts symmetrical. No skin abnormalities bilateral breasts. No nipple retraction bilateral breasts. No nipple discharge bilateral breasts. No lymphadenopathy. No lumps palpated bilateral breasts. MS DIGITAL SCREENING TOMO BILATERAL  Result Date: 08/08/2021 CLINICAL DATA:  Screening. EXAM: DIGITAL SCREENING BILATERAL MAMMOGRAM WITH TOMOSYNTHESIS AND CAD TECHNIQUE: Bilateral screening digital craniocaudal and mediolateral oblique mammograms were obtained. Bilateral screening digital breast tomosynthesis was performed. The images were evaluated with computer-aided detection. COMPARISON:  Previous exam(s). ACR Breast Density Category b: There are scattered areas of fibroglandular density. FINDINGS: There are no findings suspicious for malignancy. IMPRESSION: No mammographic evidence of malignancy. A result letter of this screening mammogram will be mailed directly to the patient. RECOMMENDATION: Screening mammogram in one year. (Code:SM-B-01Y) BI-RADS CATEGORY  1: Negative. Electronically Signed   By: Edwin Cap M.D.   On: 08/08/2021 07:52   MS DIGITAL SCREENING TOMO BILATERAL  Result Date: 07/27/2020 CLINICAL DATA:  Screening. EXAM: DIGITAL SCREENING BILATERAL MAMMOGRAM WITH TOMOSYNTHESIS AND CAD TECHNIQUE: Bilateral screening digital craniocaudal and mediolateral oblique mammograms were obtained. Bilateral  screening digital breast tomosynthesis was performed. The images were evaluated with computer-aided detection. COMPARISON:  Previous exam(s). ACR Breast Density Category b: There are scattered areas of fibroglandular density. FINDINGS: There are no findings suspicious for malignancy. The images were evaluated with computer-aided detection. IMPRESSION: No mammographic evidence of malignancy. A result letter of this screening mammogram will be mailed directly to the patient. RECOMMENDATION: Screening mammogram in one year. (Code:SM-B-01Y) BI-RADS CATEGORY  1: Negative. Electronically Signed   By: Baird Lyons M.D.   On: 07/27/2020 08:38          Pelvic/Bimanual Pap is not indicated today    Smoking History: Patient has never smoked and was not referred to quit line.    Patient Navigation: Patient education provided. Access to services provided for patient through [facility] program. Aliene Beams interpreter provided. No transportation provided   Colorectal Cancer Screening: Per patient has never had colonoscopy completed No complaints today. She follows with GI for her liver and has colon screening with their office. She is scheduled for 10/22/22.    Breast and Cervical Cancer Risk Assessment: Patient does not have family history of breast cancer, known genetic mutations, or radiation treatment to the chest before age 48. Patient does not have history of cervical dysplasia, immunocompromised, or DES exposure in-utero.  Risk Assessment   No risk assessment data for the current encounter  Risk Scores       08/07/2021   Last edited by: Lesle Chris, RN   5-year risk: 0.5 %   Lifetime risk: 4.3 %            A: [facility] exam without pap smear No complaints with benign exam.   P: [facility]. Appointment scheduled 08/12/22.  Pascal Lux, NP  08/12/2022 3:02 PM

## 2022-08-12 NOTE — Patient Instructions (Signed)
Taught Shannan Harper Derrell Lolling about self breast awareness and gave educational materials to take home. Patient did not need a Pap smear today due to last Pap smear was in 2022 per patient.  Let her know [facility] will cover Pap smears every 5 years unless has a history of abnormal Pap smears. [facility] for diagnostic mammogram. Appointment scheduled for 08/12/22. Patient aware of appointment and will be there. Let patient know will follow up with her within the next couple weeks with results. Shannan Harper Derrell Lolling verbalized understanding.  Pascal Lux, NP 3:03 PM

## 2023-06-27 ENCOUNTER — Encounter: Payer: Self-pay | Admitting: Primary Care

## 2023-07-22 ENCOUNTER — Other Ambulatory Visit: Payer: Self-pay

## 2023-07-22 DIAGNOSIS — Z1231 Encounter for screening mammogram for malignant neoplasm of breast: Secondary | ICD-10-CM

## 2023-09-01 ENCOUNTER — Ambulatory Visit: Payer: Self-pay

## 2023-09-01 ENCOUNTER — Other Ambulatory Visit: Payer: Self-pay | Admitting: Primary Care

## 2023-09-01 ENCOUNTER — Ambulatory Visit
Admission: RE | Admit: 2023-09-01 | Discharge: 2023-09-01 | Disposition: A | Discharge status: Home / Self Care | Payer: Self-pay | Source: Ambulatory Visit | Attending: Primary Care | Admitting: Primary Care

## 2023-09-01 DIAGNOSIS — Z1231 Encounter for screening mammogram for malignant neoplasm of breast: Secondary | ICD-10-CM

## 2023-12-25 ENCOUNTER — Ambulatory Visit

## 2023-12-25 DIAGNOSIS — R21 Rash and other nonspecific skin eruption: Secondary | ICD-10-CM

## 2023-12-25 DIAGNOSIS — H00012 Hordeolum externum right lower eyelid: Secondary | ICD-10-CM | POA: Diagnosis not present

## 2023-12-25 MED ORDER — ZORYVE 0.3 % EX FOAM: 1.0000 | Freq: Every day | CUTANEOUS | 5 refills | Status: AC

## 2023-12-25 NOTE — Progress Notes (Signed)
   New Patient Visit   Subjective  Kara Alexander is a 53 y.o. female who presents for the following: Rash one face, neck, ears x1 month. Rash is mildly itchy and occasionally painful. No hx of eczema or rosacea. Patient reports no change in skin care or hair products. 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   The following portions of the chart were reviewed this encounter and updated as appropriate: medications, allergies, medical history  Review of Systems:  No other skin or systemic complaints except as noted in HPI or Assessment and Plan.  Objective  Well appearing patient in no apparent distress; mood and affect are within normal limits.  A focused examination was performed of the following areas: Face, Ears, Neck  Erythematous scaly plaques of forehead, cheeks, ears Occasional pustule noted  Erythematous pustule R lower eyelid margin   Relevant exam findings are noted in the Assessment and Plan.                   Assessment & Plan   Rash of uncertain etiology - ACD vs Seborrheic dermatitis vs rosacea vs less likely autoimmune  - Undiagnosed new problem with uncertain prognosis  - Differential diagnosis, treatment options, prognosis, risk/ benefit, and side effects of treatment were discussed with the patient.  - Zoryve  0.15% cream sample given in office - provided 0.3% foam prescription  - If not improved at next visit will consider biopsy   Stye of R eye  - Recommend ophthalmology evaluation -Offered referral - patient will call back with office she would like referral sent to     Return in about 3 weeks (around 01/15/2024) for rash.  I, Emerick Ege, CMA am acting as scribe for Lauraine JAYSON Kanaris, MD   Documentation: I have reviewed the above documentation for accuracy and completeness, and I agree with the above.  Lauraine JAYSON Kanaris, MD

## 2023-12-25 NOTE — Patient Instructions (Signed)

## 2024-01-05 ENCOUNTER — Telehealth: Payer: Self-pay

## 2024-01-05 NOTE — Telephone Encounter (Signed)
 Patient's son LVM that Zoryve  foam is not covered and is very expensive. Was prescribed for rash at face, neck and ears.  Lonell RAMAN., RMA

## 2024-01-06 ENCOUNTER — Ambulatory Visit

## 2024-01-06 DIAGNOSIS — R21 Rash and other nonspecific skin eruption: Secondary | ICD-10-CM | POA: Diagnosis not present

## 2024-01-06 DIAGNOSIS — L719 Rosacea, unspecified: Secondary | ICD-10-CM | POA: Diagnosis not present

## 2024-01-06 MED ORDER — METRONIDAZOLE 0.75 % EX CREA: TOPICAL_CREAM | Freq: Two times a day (BID) | CUTANEOUS | 5 refills | Status: AC

## 2024-01-06 MED ORDER — SULFACETAMIDE SODIUM-SULFUR 10-1 % EX EMUL: CUTANEOUS | 0 refills | Status: DC

## 2024-01-06 MED ORDER — METRONIDAZOLE 0.75 % EX CREA
TOPICAL_CREAM | Freq: Two times a day (BID) | CUTANEOUS | 5 refills | Status: DC
Start: 2024-01-06 — End: 2024-01-06

## 2024-01-06 NOTE — Patient Instructions (Addendum)
 - Start compounded Azelaic Acid 15%, Metronidazole  1%, Ivermectin 1% Cream from [facility]  Instructions for [facility] Medications  One or more of your medications was sent to the [facility] mail order compounding pharmacy. You will receive an email from them and can purchase the medicine through that link. It will then be mailed to your home at the address you confirmed. If for any reason you do not receive an email from them, please check your spam folder. If you still do not find the email, please let us  know. [facility] phone number is [phone].   Skin Care for Rosacea  Rosacea may make your skin very sensitive and you may notice a "breakout" of acne-like bumps or redness when trying new creams or soaps on your skin or when eating certain foods.  The following the instructions will help minimize your breakouts and your doctor may provide medication instructions on this form.  Avoid triggers. These may include sunlight, stress, alcohol, spicy foods, heat and hot water. As each person is different, try to find your triggers and avoid them.   When trying new products, test for irritation by applying to the side of your neck instead of your face. Do not apply steroid creams to your face as it may lead to worsening. Less is more - avoid too many products on your face including harsh "acne" cleansers and fragranced moisturizers. Some suggested products include:   Cleansers:  avoid scrubs, loofah or rough hand clothes on your face If you have dry skin, only use water to rinse your face.  If you have oily areas or wear make-up, you may use a gentle cleanser once daily. Some recommendations include:  Cetaphil, Neutrogena cleanser, Clinique, Aveeno  Moisturizers:  Apply once or twice daily. Best if fragrance free, hypoallergenic, non-comedogenic (look on label).  Moisturizers with bland sunblocks (zinc oxide or titanium dioxide) are recommended. Some recommendations  include:  Lotions: Cetaphil, Eucerin, Cerave, Neutrogena, Lubriderm, Curel, Aveeno. If you have a lot of redness: Green-tinted sunscreens and moisturizers from [employer] and others may be helpful     Due to recent changes in healthcare laws, you may see results of your pathology and/or laboratory studies on MyChart before the doctors have had a chance to review them. We understand that in some cases there may be results that are confusing or concerning to you. Please understand that not all results are received at the same time and often the doctors may need to interpret multiple results in order to provide you with the best plan of care or course of treatment. Therefore, we ask that you please give us  2 business days to thoroughly review all your results before contacting the office for clarification. Should we see a critical lab result, you will be contacted sooner.   If You Need Anything After Your Visit  If you have any questions or concerns for your doctor, please call our main line at [phone] and press option 4 to reach your doctor's medical assistant. If no one answers, please leave a voicemail as directed and we will return your call as soon as possible. Messages left after 4 pm will be answered the following business day.   You may also send us  a message via MyChart. We typically respond to MyChart messages within 1-2 business days.  For prescription refills, please ask your pharmacy to contact our office. Our fax number is [phone].  If you have an urgent issue when the clinic is closed that cannot wait  until the next business day, you can page your doctor at the number below.    Please note that while we do our best to be available for urgent issues outside of office hours, we are not available 24/7.   If you have an urgent issue and are unable to reach us , you may choose to seek medical care at your doctor's office, retail clinic, urgent care center, or emergency  room.  If you have a medical emergency, please immediately call 911 or go to the emergency department.  Pager Numbers  - Dr. Hester: [phone]  - Dr. Jackquline: [phone]  - Dr. Claudene: [phone]   In the event of inclement weather, please call our main line at [phone] for an update on the status of any delays or closures.  Dermatology Medication Tips: Please keep the boxes that topical medications come in in order to help keep track of the instructions about where and how to use these. Pharmacies typically print the medication instructions only on the boxes and not directly on the medication tubes.   If your medication is too expensive, please contact our office at [phone] option 4 or send us  a message through MyChart.   We are unable to tell what your co-pay for medications will be in advance as this is different depending on your insurance coverage. However, we may be able to find a substitute medication at lower cost or fill out paperwork to get insurance to cover a needed medication.   If a prior authorization is required to get your medication covered by your insurance company, please allow us  1-2 business days to complete this process.  Drug prices often vary depending on where the prescription is filled and some pharmacies may offer cheaper prices.  The website www.goodrx.com contains coupons for medications through different pharmacies. The prices here do not account for what the cost may be with help from insurance (it may be cheaper with your insurance), but the website can give you the price if you did not use any insurance.  - You can print the associated coupon and take it with your prescription to the pharmacy.  - You may also stop by our office during regular business hours and pick up a GoodRx coupon card.  - If you need your prescription sent electronically to a different pharmacy, notify our office through [facility] or by phone at  [phone] option 4.     Si Usted Necesita Algo Despus de Su Visita  Tambin puede enviarnos un mensaje a travs de Clinical cytogeneticist. Por lo general respondemos a los mensajes de MyChart en el transcurso de 1 a 2 das hbiles.  Para renovar recetas, por favor pida a su farmacia que se ponga en contacto con nuestra oficina. Randi lakes de fax es [place] [phone].  Si tiene un asunto urgente cuando la clnica est cerrada y que no puede esperar hasta el siguiente da hbil, puede llamar/localizar a su doctor(a) al nmero que aparece a continuacin.   Por favor, tenga en cuenta que aunque hacemos todo lo posible para estar disponibles para asuntos urgentes fuera del horario de [place], no estamos disponibles las 24 horas del da, los 7 [address]  [address] de la [place].   Si tiene un problema urgente y no puede comunicarse con nosotros, puede optar por buscar atencin mdica  en el consultorio de su doctor(a), en una clnica privada, en un centro de atencin urgente o en una sala de emergencias.  Si tiene [employer], por favor llame  inmediatamente al 911 o vaya a la sala de Sports administrator.  Nmeros de bper  - Dr. Hester: [phone]  - Dra. Jackquline: [phone]  - Dr. Claudene: [phone]   En caso de inclemencias del tiempo, por favor llame a landry capes principal al [phone] para una actualizacin sobre el [employer] de cualquier retraso o cierre.  Consejos para la medicacin en dermatologa: Por favor, guarde las cajas en las que vienen los medicamentos de uso tpico para ayudarle a seguir las instrucciones sobre dnde y cmo usarlos. Las farmacias generalmente imprimen las instrucciones del medicamento slo en las cajas y no directamente en los tubos del [place].   Si su medicamento es muy caro, por favor, pngase en contacto con landry rieger llamando al [phone] y presione la opcin 4 o envenos un mensaje a travs de Clinical cytogeneticist.   No podemos decirle cul ser su copago por los  medicamentos por adelantado ya que esto es diferente dependiendo de la cobertura de su seguro. Sin embargo, es posible que podamos encontrar un medicamento sustituto a Audiological scientist un formulario para que el seguro cubra el medicamento que se considera necesario.   Si se requiere una autorizacin previa para que su compaa de seguros malta su medicamento, por favor permtanos de 1 a 2 das hbiles para completar este proceso.  Los precios de los medicamentos varan con frecuencia dependiendo del Environmental consultant de dnde se surte la receta y alguna farmacias pueden ofrecer precios ms baratos.  El sitio web www.goodrx.com tiene cupones para medicamentos de Health and safety inspector. Los precios aqu no tienen en cuenta lo que podra costar con la ayuda del seguro (puede ser ms barato con su seguro), pero el sitio web puede darle el precio si no utiliz Tourist information centre manager.  - Puede imprimir el cupn correspondiente y llevarlo con su receta a la farmacia.  - Tambin puede pasar por nuestra oficina durante el horario de atencin regular y Education officer, museum una tarjeta de cupones de GoodRx.  - Si necesita que su receta se enve electrnicamente a una farmacia diferente, informe a nuestra oficina a travs de MyChart de [place] o por telfono llamando al [phone] y presione la opcin 4.

## 2024-01-06 NOTE — Progress Notes (Signed)
   Follow-Up Visit   Subjective  Kara Alexander is a 53 y.o. female who presents for the following: Patient here for 3-week rash f/u. Patient reports more irritation since last visit, has been using zoryve  0.15% cream sample, but not helping. Patient has also noticed spreading to back of ears, does report itching associated as well. Patient noticed this slowly started happening in July, started as a small pimple. Patient states she has only ever used [place] on her face.   Patient was also seen by ophthalmologist and prescribed some drops that did help, does have upcoming follow-up to see them.  This patient is accompanied in the office by interpreter.   The following portions of the chart were reviewed this encounter and updated as appropriate: medications, allergies, medical history  Review of Systems:  No other skin or systemic complaints except as noted in HPI or Assessment and Plan.  Objective  Well appearing patient in no apparent distress; mood and affect are within normal limits.  A focused examination was performed of the following areas: Face, ears, neck   Cheeks, chin, forehead with erythematous papules and pustules                Assessment & Plan   Rash of uncertain etiology  - Undiagnosed new problem with uncertain prognosis  - Differential diagnosis, treatment options, prognosis, risk/ benefit, and side effects of treatment were discussed with the patient. - Previously tried/failed: zoryve  - Today more consistent with rosacea. Considered other causes including lupus miliaris disseminatus faciei vs periorificial dermatitis vs demodex folliculitis vs other CTD given known autoimmune hepatitis. Rosacea would best explain eye irritation. Will treat with triple cream as per below. Unable to use doxycycline as decreases levels of cellcept.   Rosacea Chronic and persistent condition with duration or expected duration over one year. Condition is symptomatic and  bothersome to patient. Patient is flaring and not currently at treatment goal.   - Reviewed etiology and probable recurrent nature of this condition - Discussed potential triggers including caffeine, heat, sun exposure, chocolate, etc. and recommended patient attempt to identify and avoid triggers -D/c Zoryve  0.15% cream.  - Start compounded Azelaic Acid 15%, Metronidazole  1%, Ivermectin 1% Cream from [facility] - Start sulfacetamide , wash face daily.  - Sun avoidance, protective clothing sunscreen discussed -Discussed oral doxycyline would interact with liver medication and do not recommend starting until doctor approves starting.  -Discussed if not improved at follow-up consider biopsy at next visit.  - Encouraged continued follow up with eye doctor      Return for 3-4 weeks , w/ Dr. Raymund.  I, Jacquelynn V. Wilfred, CMA, am acting as scribe for Lauraine JAYSON Raymund, MD .   Documentation: I have reviewed the above documentation for accuracy and completeness, and I agree with the above.  Lauraine JAYSON Raymund, MD

## 2024-01-07 ENCOUNTER — Other Ambulatory Visit: Payer: Self-pay

## 2024-01-07 MED ORDER — SULFACETAMIDE SODIUM-SULFUR 10-1 % EX EMUL: CUTANEOUS | 0 refills | Status: AC

## 2024-01-07 NOTE — Progress Notes (Signed)
 Change of pharmacy per patient request. aw

## 2024-01-13 ENCOUNTER — Telehealth: Payer: Self-pay

## 2024-01-13 NOTE — Telephone Encounter (Signed)
 Called and spoke w/ patient's son for update. Notes patient received triple cream and face wash and is having improvement on this. Will hold on initiation of doxy given interaction with cellcept. Advised can check with hepatologist if this would be ok to use in the future if not controlled on topicals.

## 2024-01-14 ENCOUNTER — Ambulatory Visit

## 2024-02-02 ENCOUNTER — Ambulatory Visit (INDEPENDENT_AMBULATORY_CARE_PROVIDER_SITE_OTHER)

## 2024-02-02 DIAGNOSIS — H5789 Other specified disorders of eye and adnexa: Secondary | ICD-10-CM

## 2024-02-02 DIAGNOSIS — L719 Rosacea, unspecified: Secondary | ICD-10-CM

## 2024-02-02 NOTE — Patient Instructions (Signed)

## 2024-02-02 NOTE — Progress Notes (Signed)
   Follow-Up Visit   Subjective  Kara Alexander is a 53 y.o. female who presents for the following: rosacea - pt states improved on ,  topical compounding cream from [facility].   Still with eye irritation - will see Ophtho this week.   The following portions of the chart were reviewed this encounter and updated as appropriate: medications, allergies, medical history  Review of Systems:  No other skin or systemic complaints except as noted in HPI or Assessment and Plan.  Objective  Well appearing patient in no apparent distress; mood and affect are within normal limits.   A focused examination was performed of the following areas: the face - clear today - erythema of R lower eyelid margin, conjunctiva    Relevant exam findings are noted in the Assessment and Plan.    Assessment & Plan          Rosacea Chronic stable condition with intermittent flares  - Reviewed etiology and probable recurrent nature of this condition - Discussed potential triggers including caffeine, heat, sun exposure, chocolate, etc. and recommended patient attempt to identify and avoid triggers - May restart compounded Azelaic Acid 15%, Metronidazole  1%, Ivermectin 1% Cream from [facility] PRN flares OR may use QD to prevent flares. - Sun avoidance, protective clothing sunscreen discussed  R eye irritation  - ocular rosacea vs cataract vs other  - Pt has appointment with ophthalmologist tomorrow.   Return if symptoms worsen or fail to improve.  LILLETTE Rosina Mayans, CMA, am acting as scribe for Lauraine JAYSON Kanaris, MD .   Documentation: I have reviewed the above documentation for accuracy and completeness, and I agree with the above.  Lauraine JAYSON Kanaris, MD

## 2024-05-30 ENCOUNTER — Emergency Department

## 2024-05-30 ENCOUNTER — Other Ambulatory Visit: Payer: Self-pay

## 2024-05-30 ENCOUNTER — Emergency Department
Admission: EM | Admit: 2024-05-30 | Discharge: 2024-05-30 | Disposition: A | Discharge status: Home / Self Care | Attending: Emergency Medicine | Admitting: Emergency Medicine

## 2024-05-30 DIAGNOSIS — K922 Gastrointestinal hemorrhage, unspecified: Secondary | ICD-10-CM | POA: Insufficient documentation

## 2024-05-30 DIAGNOSIS — E876 Hypokalemia: Secondary | ICD-10-CM | POA: Insufficient documentation

## 2024-05-30 DIAGNOSIS — I10 Essential (primary) hypertension: Secondary | ICD-10-CM | POA: Insufficient documentation

## 2024-05-30 LAB — CBC
HCT: 38.3 % (ref 36.0–46.0)
Hemoglobin: 12.9 g/dL (ref 12.0–15.0)
MCH: 30.9 pg (ref 26.0–34.0)
MCHC: 33.7 g/dL (ref 30.0–36.0)
MCV: 91.8 fL (ref 80.0–100.0)
Platelets: 188 10*3/uL (ref 150–400)
RBC: 4.17 MIL/uL (ref 3.87–5.11)
RDW: 12.6 % (ref 11.5–15.5)
WBC: 5.9 10*3/uL (ref 4.0–10.5)
nRBC: 0 % (ref 0.0–0.2)

## 2024-05-30 LAB — URINALYSIS, ROUTINE W REFLEX MICROSCOPIC
Bilirubin Urine: NEGATIVE
Glucose, UA: NEGATIVE mg/dL
Ketones, ur: NEGATIVE mg/dL
Nitrite: NEGATIVE
Protein, ur: NEGATIVE mg/dL
Specific Gravity, Urine: 1.002 — ABNORMAL LOW (ref 1.005–1.030)
pH: 7 (ref 5.0–8.0)

## 2024-05-30 LAB — TYPE AND SCREEN
ABO/RH(D): O POS
Antibody Screen: NEGATIVE

## 2024-05-30 LAB — COMPREHENSIVE METABOLIC PANEL WITH GFR
ALT: 24 U/L (ref 0–44)
AST: 22 U/L (ref 15–41)
Albumin: 3.9 g/dL (ref 3.5–5.0)
Alkaline Phosphatase: 112 U/L (ref 38–126)
Anion gap: 8 (ref 5–15)
BUN: 12 mg/dL (ref 6–20)
CO2: 25 mmol/L (ref 22–32)
Calcium: 8.9 mg/dL (ref 8.9–10.3)
Chloride: 105 mmol/L (ref 98–111)
Creatinine, Ser: 0.62 mg/dL (ref 0.44–1.00)
GFR, Estimated: >60 mL/min
Glucose, Bld: 116 mg/dL — ABNORMAL HIGH (ref 70–99)
Potassium: 3.2 mmol/L — ABNORMAL LOW (ref 3.5–5.1)
Sodium: 138 mmol/L (ref 135–145)
Total Bilirubin: 0.4 mg/dL (ref 0.0–1.2)
Total Protein: 7.8 g/dL (ref 6.5–8.1)

## 2024-05-30 LAB — POC URINE PREG, ED: Preg Test, Ur: NEGATIVE

## 2024-05-30 LAB — HEMOGLOBIN AND HEMATOCRIT, BLOOD
HCT: 36.1 % (ref 36.0–46.0)
Hemoglobin: 12.2 g/dL (ref 12.0–15.0)

## 2024-05-30 MED ORDER — POTASSIUM CHLORIDE CRYS ER 20 MEQ PO TBCR
40.0000 meq | EXTENDED_RELEASE_TABLET | Freq: Once | ORAL | Status: AC
  Administered 2024-05-30: 40 meq via ORAL
  Filled 2024-05-30: qty 2

## 2024-05-30 MED ORDER — IOHEXOL 350 MG/ML SOLN
100.0000 mL | Freq: Once | INTRAVENOUS | Status: AC | PRN
  Administered 2024-05-30: 100 mL via INTRAVENOUS

## 2024-05-30 NOTE — ED Provider Notes (Signed)
 "  [facility] Provider Note    Event Date/Time   First MD Initiated Contact with Patient 05/30/24 1559     (approximate)   History   Chief Complaint Diarrhea   HPI  Kara Alexander is a 54 y.o. female with past medical history of hypertension and autoimmune hepatitis who presents to the ED complaining of diarrhea.  Patient reports that since waking up this morning she has had 4 bowel movements containing dark blood and only a small amount of stool.  She denies any associated pain and has not had any nausea or vomiting.  She has never had similar symptoms in the past and does not take a blood thinner.  She denies regular NSAID use or alcohol consumption.     Physical Exam   Triage Vital Signs: ED Triage Vitals [05/30/24 1342]  Encounter Vitals Group     BP (!) 155/109     Girls Systolic BP Percentile      Girls Diastolic BP Percentile      Boys Systolic BP Percentile      Boys Diastolic BP Percentile      Pulse Rate 84     Resp 16     Temp 98.4 F (36.9 C)     Temp Source Oral     SpO2 95 %     Weight 156 lb 4.9 oz (70.9 kg)     Height      Head Circumference      Peak Flow      Pain Score 0     Pain Loc      Pain Education      Exclude from Growth Chart     Most recent vital signs: Vitals:   05/30/24 1342 05/30/24 1918  BP: (!) 155/109 (!) 150/99  Pulse: 84 81  Resp: 16 17  Temp: 98.4 F (36.9 C) 98.4 F (36.9 C)  SpO2: 95% 97%    Constitutional: Alert and oriented. Eyes: Conjunctivae are normal. Head: Atraumatic. Nose: No congestion/rhinnorhea. Mouth/Throat: Mucous membranes are moist.  Cardiovascular: Normal rate, regular rhythm. Grossly normal heart sounds.  2+ radial pulses bilaterally. Respiratory: Normal respiratory effort.  No retractions. Lungs CTAB. Gastrointestinal: Soft and nontender. No distention. Musculoskeletal: No lower extremity tenderness nor edema.  Neurologic:  Normal speech and language. No gross  focal neurologic deficits are appreciated.    ED Results / Procedures / Treatments   Labs (all labs ordered are listed, but only abnormal results are displayed) Labs Reviewed  COMPREHENSIVE METABOLIC PANEL WITH GFR - Abnormal; Notable for the following components:      Result Value   Potassium 3.2 (*)    Glucose, Bld 116 (*)    All other components within normal limits  URINALYSIS, ROUTINE W REFLEX MICROSCOPIC - Abnormal; Notable for the following components:   Color, Urine STRAW (*)    APPearance CLEAR (*)    Specific Gravity, Urine 1.002 (*)    Hgb urine dipstick SMALL (*)    Leukocytes,Ua TRACE (*)    Bacteria, UA RARE (*)    All other components within normal limits  CBC  HEMOGLOBIN AND HEMATOCRIT, BLOOD  POC URINE PREG, ED  POC OCCULT BLOOD, ED  TYPE AND SCREEN    RADIOLOGY CTA abdomen/pelvis reviewed and interpreted by me with no active extravasation noted.  PROCEDURES:  Critical Care performed: No  Procedures   MEDICATIONS ORDERED IN ED: Medications  potassium chloride  SA (KLOR-CON  M) CR tablet 40 mEq (40 mEq Oral  Given 05/30/24 1732)  iohexol  (OMNIPAQUE ) 350 MG/ML injection 100 mL (100 mLs Intravenous Contrast Given 05/30/24 1711)     IMPRESSION / MDM / ASSESSMENT AND PLAN / ED COURSE  I reviewed the triage vital signs and the nursing notes.                              54 y.o. female with past medical history of hypertension and autoimmune hepatitis presents to the ED complaining of multiple bloody bowel movements since this morning.  Patient's presentation is most consistent with acute presentation with potential threat to life or bodily function.  Differential diagnosis includes, but is not limited to, upper GI bleed, lower GI bleed, diverticular bleed, rectal bleeding, anemia, electrolyte abnormality, AKI, hepatitis.  Patient nontoxic-appearing and in no acute distress, vital signs remarkable for hypertension but otherwise reassuring.  She has a benign  abdominal exam but given report of significant bleeding within the past 12 hours, will check CTA of her abdomen/pelvis for potential source of bleeding.  Labs without significant anemia with hemoglobin of 12.9, although most recent labs for comparison were 5 years ago.  She has mild hypokalemia but no significant AKI or LFT abnormality.  Pregnancy testing is negative and urinalysis unremarkable.  Plan to trend H&H while awaiting CTA results.  CTA abdomen/pelvis negative for active extravasation, repeat H&H with very mild drop but patient has had no further bleeding here in the ED.  She is agreeable with plan for discharge home, has an appointment at [facility] for colonoscopy later this month.  She was counseled to return to the ED for new or worsening symptoms, patient and family agree with plan.      FINAL CLINICAL IMPRESSION(S) / ED DIAGNOSES   Final diagnoses:  Lower GI bleed     Rx / DC Orders   ED Discharge Orders     None        Note:  This document was prepared using Dragon voice recognition software and may include unintentional dictation errors.   Willo Dunnings, MD 05/30/24 1951  "

## 2024-05-30 NOTE — ED Triage Notes (Signed)
 C/O diarrhea this morning, seeing blood in diarrhea, passing ?blood clots. AAOx3. Skin warm and dry. Denies abd pain, denies dysuria.
